# Patient Record
Sex: Male | Born: 2000 | Race: Black or African American | Hispanic: No | Marital: Single | Smoking: Never smoker
Health system: Southern US, Community
[De-identification: ages and names within clinical notes are randomized; demographics above are authoritative.]

## PROBLEM LIST (undated history)

## (undated) DIAGNOSIS — J45909 Unspecified asthma, uncomplicated: Secondary | ICD-10-CM

---
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
## 2021-04-08 ENCOUNTER — Encounter (HOSPITAL_COMMUNITY): Payer: Self-pay

## 2021-04-08 ENCOUNTER — Other Ambulatory Visit: Payer: Self-pay

## 2021-04-08 ENCOUNTER — Emergency Department (HOSPITAL_COMMUNITY)
Admission: EM | Admit: 2021-04-08 | Discharge: 2021-04-08 | Disposition: A | Discharge status: Home / Self Care | Payer: Medicaid Other | Attending: Emergency Medicine | Admitting: Emergency Medicine

## 2021-04-08 DIAGNOSIS — R0981 Nasal congestion: Secondary | ICD-10-CM | POA: Insufficient documentation

## 2021-04-08 DIAGNOSIS — R059 Cough, unspecified: Secondary | ICD-10-CM | POA: Diagnosis not present

## 2021-04-08 DIAGNOSIS — Z20822 Contact with and (suspected) exposure to covid-19: Secondary | ICD-10-CM | POA: Insufficient documentation

## 2021-04-08 DIAGNOSIS — M791 Myalgia, unspecified site: Secondary | ICD-10-CM | POA: Insufficient documentation

## 2021-04-08 DIAGNOSIS — R6889 Other general symptoms and signs: Secondary | ICD-10-CM

## 2021-04-08 LAB — RESP PANEL BY RT-PCR (FLU A&B, COVID) ARPGX2
Influenza A by PCR: NEGATIVE
Influenza B by PCR: NEGATIVE
SARS Coronavirus 2 by RT PCR: NEGATIVE

## 2021-04-08 MED ORDER — IBUPROFEN 200 MG PO TABS
600.0000 mg | ORAL_TABLET | Freq: Once | ORAL | Status: AC
  Administered 2021-04-08: 600 mg via ORAL
  Filled 2021-04-08: qty 3

## 2021-04-08 NOTE — ED Triage Notes (Signed)
Patient arrives from home with c/o cough, congestion, and SOB that began Monday after receiving his second COVID vaccination.

## 2021-04-08 NOTE — ED Provider Notes (Signed)
[facility]   CSN: [id number] Arrival date & time: 04/08/21  2001     History  Chief Complaint  Patient presents with   Cough   Nasal Congestion    William Hendricks is a 21 y.o. male who presents to the ED for mild cough, congestion and body aches that started earlier today.  Patient states he had the second COVID-vaccine 2 days prior and is concerned that he has COVID.  ED states that he had no reaction to the first vaccine.  He denies fevers, chills, shortness of breath.  He has no other complaints.   Cough Associated symptoms: myalgias   Associated symptoms: no fever, no headaches, no rash and no shortness of breath       Home Medications Prior to Admission medications   Medication Sig Start Date End Date Taking? Authorizing Provider  albuterol (VENTOLIN HFA) 108 (90 Base) MCG/ACT inhaler Inhale 2 puffs into the lungs every 6 (six) hours as needed for up to 10 days for wheezing or shortness of breath. 10/30/19 11/09/19  Mayers, Cari S, PA-C  naproxen (NAPROSYN) 375 MG tablet Take 1 tablet (375 mg total) by mouth 2 (two) times daily. Patient not taking: Reported on 10/30/2019 11/05/17   Patterson Hammersmith C, PA-C      Allergies    Patient has no known allergies.    Review of Systems   Review of Systems  Constitutional:  Negative for fever.  HENT: Negative.    Eyes: Negative.   Respiratory:  Positive for cough. Negative for shortness of breath.   Cardiovascular: Negative.   Gastrointestinal:  Negative for abdominal pain and vomiting.  Endocrine: Negative.   Genitourinary: Negative.   Musculoskeletal:  Positive for myalgias.  Skin:  Negative for rash.  Neurological:  Negative for headaches.  All other systems reviewed and are negative.  Physical Exam Updated Vital Signs BP 109/84 (BP Location: Left Arm)    Pulse 100    Temp 98.3 F (36.8 C) (Oral)    Resp 18    Ht 5\' 9"  (1.753 m)    Wt 58.5 kg    SpO2 100%    BMI 19.05 kg/m   Physical Exam Vitals and nursing note reviewed.  Constitutional:      General: He is not in acute distress.    Appearance: He is not ill-appearing.  HENT:     Head: Atraumatic.  Eyes:     Conjunctiva/sclera: Conjunctivae normal.  Cardiovascular:     Rate and Rhythm: Normal rate and regular rhythm.     Pulses: Normal pulses.     Heart sounds: No murmur heard. Pulmonary:     Effort: Pulmonary effort is normal. No respiratory distress.     Breath sounds: Normal breath sounds.     Comments: Speaking in full and complete sentences Abdominal:     General: Abdomen is flat. There is no distension.     Palpations: Abdomen is soft.     Tenderness: There is no abdominal tenderness.  Musculoskeletal:        General: Normal range of motion.     Cervical back: Normal range of motion.  Skin:    General: Skin is warm and dry.     Capillary Refill: Capillary refill takes less than 2 seconds.  Neurological:     General: No focal deficit present.     Mental Status: He is alert.  Psychiatric:        Mood and Affect: Mood  normal.    ED Results / Procedures / Treatments   Labs (all labs ordered are listed, but only abnormal results are displayed) Labs Reviewed  RESP PANEL BY RT-PCR (FLU A&B, COVID) ARPGX2    EKG None  Radiology No results found.  Procedures Procedures    Medications Ordered in ED Medications  ibuprofen (ADVIL) tablet 600 mg (600 mg Oral Given 04/08/21 2051)    ED Course/ Medical Decision Making/ A&P                           Medical Decision Making Risk OTC drugs.   History:  Per HPI  Initial impression:  This patient presents to the ED for concern of cough, this involves an extensive number of treatment options, and is a complaint that carries with it a high risk of complications and morbidity.     ED Course: Patient is well-appearing, vitals are stable he is in no apparent distress.  He is speaking in full complete sentences.  No acute findings on  exam.  I suspect that his symptoms are secondary to COVID-vaccine side effects, however will obtain COVID and flu panel for patient reassurance.  I will also give ibuprofen for body aches. Respiratory panel negative.  Patient feels much better after ibuprofen 600 mg.  Vitals remained stable.  Lab Tests and EKG:  I Ordered, reviewed, and interpreted labs and EKG.  The pertinent results are listed above  Disposition:  After consideration of the diagnostic results, physical exam, history and the patients response to treatment feel that the patent would benefit from discharge.   Flulike symptoms: Patient symptoms are likely side effect from COVID-vaccine.  No discernible symptoms on physical exam today.  Respiratory panel negative.  Reassurance provided and return precautions were discussed.  All questions asked and answered.  Discharged home in good condition         Final Clinical Impression(s) / ED Diagnoses Final diagnoses:  None    Rx / DC Orders ED Discharge Orders     None         Delight Ovens 04/08/21 2312    Linwood Dibbles, MD 04/11/21 0730

## 2021-04-08 NOTE — Discharge Instructions (Signed)
Your COVID and flu test were negative today.  I suspect her symptoms are secondary to the side effects of the COVID-vaccine.  Continue taking ibuprofen or Tylenol for your body aches and discomfort. Follow up with your primary care as needed.

## 2022-01-04 ENCOUNTER — Encounter (HOSPITAL_COMMUNITY): Payer: Self-pay | Admitting: Emergency Medicine

## 2022-01-04 ENCOUNTER — Emergency Department (HOSPITAL_COMMUNITY)
Admission: EM | Admit: 2022-01-04 | Discharge: 2022-01-04 | Disposition: A | Discharge status: Home / Self Care | Payer: Medicaid Other | Attending: Emergency Medicine | Admitting: Emergency Medicine

## 2022-01-04 DIAGNOSIS — J020 Streptococcal pharyngitis: Secondary | ICD-10-CM | POA: Diagnosis not present

## 2022-01-04 DIAGNOSIS — J029 Acute pharyngitis, unspecified: Secondary | ICD-10-CM | POA: Diagnosis present

## 2022-01-04 LAB — GROUP A STREP BY PCR: Group A Strep by PCR: DETECTED — AB

## 2022-01-04 MED ORDER — PENICILLIN G BENZATHINE 1200000 UNIT/2ML IM SUSY
1.2000 10*6.[IU] | PREFILLED_SYRINGE | Freq: Once | INTRAMUSCULAR | Status: AC
Start: 2022-01-04 — End: 2022-01-04
  Administered 2022-01-04: 1.2 10*6.[IU] via INTRAMUSCULAR
  Filled 2022-01-04 (×2): qty 2

## 2022-01-04 MED ORDER — PENICILLIN G BENZATHINE 600000 UNIT/ML IM SUSY: 600000.0000 [IU] | PREFILLED_SYRINGE | Freq: Once | INTRAMUSCULAR | Status: DC

## 2022-01-04 NOTE — ED Provider Notes (Addendum)
[facility] EMERGENCY DEPARTMENT Provider Note   CSN: [id number] Arrival date & time: 01/04/22  0802     History  Chief Complaint  Patient presents with   Sore Throat   HPI William Hendricks is a 21 y.o. male presenting for sore throat. Started about a week ago. Throat has been sore and "scratchy". Patient also states he's "felt tired and sick". Denies fever and cough. Denies sick contact. Denies trouble swallowing and drooling.    Sore Throat       Home Medications Prior to Admission medications   Medication Sig Start Date End Date Taking? Authorizing Provider  albuterol (VENTOLIN HFA) 108 (90 Base) MCG/ACT inhaler Inhale 2 puffs into the lungs every 6 (six) hours as needed for up to 10 days for wheezing or shortness of breath. 10/30/19 11/09/19  Mayers, Cari S, PA-C  naproxen (NAPROSYN) 375 MG tablet Take 1 tablet (375 mg total) by mouth 2 (two) times daily. Patient not taking: Reported on 10/30/2019 11/05/17   Debara Pickett C, PA-C      Allergies    Patient has no known allergies.    Review of Systems   Review of Systems  HENT:  Positive for sore throat.     Physical Exam Updated Vital Signs BP 124/75 (BP Location: Right Arm)   Pulse 90   Temp 98.1 F (36.7 C) (Oral)   Resp 16   SpO2 100%  Physical Exam Vitals and nursing note reviewed.  HENT:     Head: Normocephalic and atraumatic.     Mouth/Throat:     Mouth: Mucous membranes are moist.     Pharynx: Uvula midline. Oropharyngeal exudate and posterior oropharyngeal erythema present.     Tonsils: Tonsillar exudate present.  Eyes:     General:        Right eye: No discharge.        Left eye: No discharge.     Conjunctiva/sclera: Conjunctivae normal.  Cardiovascular:     Rate and Rhythm: Normal rate and regular rhythm.     Pulses: Normal pulses.     Heart sounds: Normal heart sounds.  Pulmonary:     Effort: Pulmonary effort is normal.     Breath sounds: Normal breath sounds.  Abdominal:      General: Abdomen is flat.     Palpations: Abdomen is soft.  Skin:    General: Skin is warm and dry.  Neurological:     General: No focal deficit present.  Psychiatric:        Mood and Affect: Mood normal.     ED Results / Procedures / Treatments   Labs (all labs ordered are listed, but only abnormal results are displayed) Labs Reviewed  GROUP A STREP BY PCR - Abnormal; Notable for the following components:      Result Value   Group A Strep by PCR DETECTED (*)    All other components within normal limits    EKG None  Radiology No results found.  Procedures Procedures    Medications Ordered in ED Medications  penicillin g benzathine (BICILLIN LA) 1200000 UNIT/2ML injection 1.2 Million Units (has no administration in time range)    ED Course/ Medical Decision Making/ A&P                           Medical Decision Making Risk Prescription drug management.   Patient presented for sore throat. Exam revealed tonsillar exudate and erythematous posterior  pharynx. Patient also mentioned no cough. Rapid strep was positive. Symptoms, exam and positive rapid strep consistent with strep pharyngitis. Patient requested one time shot. Treated with IM Bicillin. Discussed return precautions. Did consider peritonsillar abscess but unlikely given no peritonsillar swelling. Advise patient to f/u with PCP if symptoms persisted.         Final Clinical Impression(s) / ED Diagnoses Final diagnoses:  Strep pharyngitis    Rx / DC Orders ED Discharge Orders     None         Harriet Pho, PA-C 01/04/22 0934    Harriet Pho, PA-C 01/04/22 [id number]    Margette Fast, MD 01/04/22 1009

## 2022-01-04 NOTE — Discharge Instructions (Addendum)
Evaluation of your sore throat revealed that you have strep pharyngitis or "strep throat". Treated with one time shot of Bicillin which is an antibiotic. Advise to follow up with PCP if symptoms persist.

## 2022-01-04 NOTE — ED Triage Notes (Signed)
Pt here from home with c/o sore throat , throat is red and painful to swallow  

## 2022-05-25 ENCOUNTER — Emergency Department (HOSPITAL_COMMUNITY)
Admission: EM | Admit: 2022-05-25 | Discharge: 2022-05-25 | Discharge status: Against Medical Advice | Payer: Medicaid Other | Attending: Emergency Medicine | Admitting: Emergency Medicine

## 2022-05-25 ENCOUNTER — Other Ambulatory Visit: Payer: Self-pay

## 2022-05-25 ENCOUNTER — Encounter (HOSPITAL_COMMUNITY): Payer: Self-pay

## 2022-05-25 DIAGNOSIS — Z5321 Procedure and treatment not carried out due to patient leaving prior to being seen by health care provider: Secondary | ICD-10-CM | POA: Diagnosis not present

## 2022-05-25 DIAGNOSIS — R200 Anesthesia of skin: Secondary | ICD-10-CM | POA: Diagnosis not present

## 2022-05-25 DIAGNOSIS — R4701 Aphasia: Secondary | ICD-10-CM | POA: Diagnosis not present

## 2022-05-25 DIAGNOSIS — R29810 Facial weakness: Secondary | ICD-10-CM | POA: Insufficient documentation

## 2022-05-25 DIAGNOSIS — Z1152 Encounter for screening for COVID-19: Secondary | ICD-10-CM | POA: Insufficient documentation

## 2022-05-25 LAB — RESP PANEL BY RT-PCR (RSV, FLU A&B, COVID)  RVPGX2
Influenza A by PCR: NEGATIVE
Influenza B by PCR: NEGATIVE
Resp Syncytial Virus by PCR: NEGATIVE
SARS Coronavirus 2 by RT PCR: NEGATIVE

## 2022-05-25 NOTE — ED Notes (Signed)
Informed by NT that pt left

## 2022-05-25 NOTE — ED Triage Notes (Signed)
Pt reports sitting in parking lot yesterday when right hand went numb, facial drooping, congestion, runny nose, headache and suddenly had aphasia.  Pt reports all symptoms have resolved now besides headache, runny nose, and congestion

## 2022-07-25 ENCOUNTER — Emergency Department (HOSPITAL_COMMUNITY)
Admission: EM | Admit: 2022-07-25 | Discharge: 2022-07-25 | Disposition: A | Discharge status: Home / Self Care | Payer: Medicaid Other | Attending: Emergency Medicine | Admitting: Emergency Medicine

## 2022-07-25 ENCOUNTER — Encounter (HOSPITAL_COMMUNITY): Payer: Self-pay

## 2022-07-25 ENCOUNTER — Emergency Department (HOSPITAL_COMMUNITY): Payer: Medicaid Other

## 2022-07-25 DIAGNOSIS — Z20822 Contact with and (suspected) exposure to covid-19: Secondary | ICD-10-CM | POA: Diagnosis not present

## 2022-07-25 DIAGNOSIS — J069 Acute upper respiratory infection, unspecified: Secondary | ICD-10-CM | POA: Insufficient documentation

## 2022-07-25 DIAGNOSIS — R059 Cough, unspecified: Secondary | ICD-10-CM | POA: Diagnosis present

## 2022-07-25 LAB — RESP PANEL BY RT-PCR (RSV, FLU A&B, COVID)  RVPGX2
Influenza A by PCR: NEGATIVE
Influenza B by PCR: NEGATIVE
Resp Syncytial Virus by PCR: NEGATIVE
SARS Coronavirus 2 by RT PCR: NEGATIVE

## 2022-07-25 MED ORDER — BENZONATATE 100 MG PO CAPS: 100.0000 mg | ORAL_CAPSULE | Freq: Three times a day (TID) | ORAL | 0 refills | Status: DC

## 2022-07-25 MED ORDER — ERYTHROMYCIN 5 MG/GM OP OINT: TOPICAL_OINTMENT | OPHTHALMIC | 0 refills | Status: DC

## 2022-07-25 NOTE — Discharge Instructions (Addendum)
It was a pleasure taking care of you!   Your COVID, RSV, Flu swab was negative. Your chest xray didn't show any acute abnormalities. You may take over the counter 600 mg Ibuprofen every 6 hours and alternate with 500 mg Tylenol every 6 hours as needed for pain for no more than 7 days.  You may take over-the-counter cough and cold medications as needed for your symptoms. For your cough, you will be sent a prescription called Tessalon perles, take as directed. Ensure to maintain fluid intake with tea, soup, broth, Pedialyte, Gatorade, water. You will be sent a prescription for erythromycin ointment, take as directed. You may follow-up with your primary care provider as needed.  Return to the Emergency Department if you are experiencing increasing/worsening symptoms.

## 2022-07-25 NOTE — ED Triage Notes (Signed)
Pt states he had visitors with a cold from 5/10-5/13. Pt c/o productive cough, runny nose, watery eyes.  Minor relief with mucinex, robotussin, dayquil, nyquil No relief with alka seltzer  Pt has taking zinc and vitamins

## 2022-07-25 NOTE — ED Provider Notes (Signed)
[place] [facility] AT [facility] Provider Note   CSN: [id number] Arrival date & time: 07/25/22  1809     History  Chief Complaint  Patient presents with   Cough    William Hendricks is a 22 y.o. male who presents emergency department with concerns for cough onset 10 days.  Notes recent sick contacts.  Has associated rhinorrhea, watery eyes, nasal congestion.  Has taken over-the-counter medications with no relief of his symptoms.  Denies trouble breathing, fever.  Patient does not wear glasses or contacts.  Notes concerns for drainage to bilateral eyes.  The history is provided by the patient. No language interpreter was used.       Home Medications Prior to Admission medications   Medication Sig Start Date End Date Taking? Authorizing Provider  benzonatate (TESSALON) 100 MG capsule Take 1 capsule (100 mg total) by mouth every 8 (eight) hours. 07/25/22  Yes Valborg Friar A, PA-C  erythromycin ophthalmic ointment Place a 1/2 inch ribbon of ointment into the lower eyelid for up to 4 (four) times a day for 5 (five) days. 07/25/22  Yes Mikyle Sox A, PA-C  albuterol (VENTOLIN HFA) 108 (90 Base) MCG/ACT inhaler Inhale 2 puffs into the lungs every 6 (six) hours as needed for up to 10 days for wheezing or shortness of breath. 10/30/19 11/09/19  Mayers, Cari S, PA-C  naproxen (NAPROSYN) 375 MG tablet Take 1 tablet (375 mg total) by mouth 2 (two) times daily. Patient not taking: Reported on 10/30/2019 11/05/17   Patterson Hammersmith C, PA-C      Allergies    Patient has no known allergies.    Review of Systems   Review of Systems  Respiratory:  Positive for cough.   All other systems reviewed and are negative.   Physical Exam Updated Vital Signs BP 126/81 (BP Location: Right Arm)   Pulse (!) 116   Temp 98.9 F (37.2 C) (Oral)   Resp 18   Ht 5\' 9"  (1.753 m)   Wt 56.7 kg   SpO2 100%   BMI 18.46 kg/m  Physical Exam Vitals and nursing note reviewed.  Constitutional:       General: He is not in acute distress.    Appearance: Normal appearance.  Eyes:     General: No scleral icterus.    Extraocular Movements: Extraocular movements intact.     Pupils: Pupils are equal, round, and reactive to light.     Comments: PERRL.  EOMI.  Cardiovascular:     Rate and Rhythm: Normal rate and regular rhythm.     Pulses: Normal pulses.     Heart sounds: Normal heart sounds.  Pulmonary:     Effort: Pulmonary effort is normal. No respiratory distress.     Breath sounds: Normal breath sounds.  Abdominal:     Palpations: Abdomen is soft. There is no mass.     Tenderness: There is no abdominal tenderness.  Musculoskeletal:        General: Normal range of motion.     Cervical back: Neck supple.  Skin:    General: Skin is warm and dry.     Findings: No rash.  Neurological:     Mental Status: He is alert.     Sensory: Sensation is intact.     Motor: Motor function is intact.  Psychiatric:        Behavior: Behavior normal.     ED Results / Procedures / Treatments   Labs (all labs ordered  are listed, but only abnormal results are displayed) Labs Reviewed  RESP PANEL BY RT-PCR (RSV, FLU A&B, COVID)  RVPGX2    EKG None  Radiology DG Chest 2 View  Result Date: 07/25/2022 CLINICAL DATA:  Cough and sore throat. EXAM: CHEST - 2 VIEW COMPARISON:  January 21, 2014 FINDINGS: The heart size and mediastinal contours are within normal limits. Both lungs are clear. The visualized skeletal structures are unremarkable. IMPRESSION: No active cardiopulmonary disease. Electronically Signed   By: Aram Candela M.D.   On: 07/25/2022 19:11    Procedures Procedures    Medications Ordered in ED Medications - No data to display  ED Course/ Medical Decision Making/ A&P Clinical Course as of 07/25/22 2017  Sun Jul 25, 2022  2000 Re-evaluated and noted improvement of symptoms with treatment regimen. Discussed discharge treatment plan. Pt agreeable at this time. Pt  appears safe for discharge. [SB]    Clinical Course User Index [SB] Clennon Nasca A, PA-C                             Medical Decision Making Amount and/or Complexity of Data Reviewed Radiology: ordered.  Risk Prescription drug management.   Pt presents with concerns for cough onset 10 days.  Sick contacts at home with similar symptoms.  No fever at home.  Patient afebrile. On exam, pt with no acute cardiovascular, respiratory, abdominal exam findings. Differential diagnosis includes COVID, flu, viral URI with cough, PNA.    Labs:  I ordered, and personally interpreted labs.  The pertinent results include:   Negative COVID, flu, RSV swab  Imaging: I ordered imaging studies including Chest x-ray I independently visualized and interpreted imaging which showed: No acute findings I agree with the radiologist interpretation  Disposition: Please stay suspicious for viral URI with cough.  Doubt concerns this time for COVID, flu, RSV, pneumonia. After consideration of the diagnostic results and the patients response to treatment, I feel that the patient would benefit from Discharge home.  Patient discharged home with prescription for Tessalon Perles and erythromycin ointment.  Will send erythromycin ointment due to patient's concerns for bilateral eye drainage.  Work note provided.  Supportive care measures and strict return precautions discussed with patient at bedside. Pt acknowledges and verbalizes understanding. Pt appears safe for discharge. Follow up as indicated in discharge paperwork.    This chart was dictated using voice recognition software, Dragon. Despite the best efforts of this provider to proofread and correct errors, errors may still occur which can change documentation meaning.   Final Clinical Impression(s) / ED Diagnoses Final diagnoses:  Viral URI with cough    Rx / DC Orders ED Discharge Orders          Ordered    benzonatate (TESSALON) 100 MG capsule  Every 8  hours        07/25/22 2015    erythromycin ophthalmic ointment        07/25/22 2015              Doral Digangi A, PA-C 07/25/22 2020    Eber Hong, MD 07/27/22 [phone]

## 2022-08-22 ENCOUNTER — Encounter (HOSPITAL_COMMUNITY): Payer: Self-pay

## 2022-08-22 ENCOUNTER — Ambulatory Visit (HOSPITAL_COMMUNITY)
Admission: EM | Admit: 2022-08-22 | Discharge: 2022-08-22 | Disposition: A | Discharge status: Home / Self Care | Payer: Medicaid Other | Attending: Emergency Medicine | Admitting: Emergency Medicine

## 2022-08-22 DIAGNOSIS — L03011 Cellulitis of right finger: Secondary | ICD-10-CM | POA: Diagnosis not present

## 2022-08-22 MED ORDER — DOXYCYCLINE HYCLATE 100 MG PO CAPS: 100.0000 mg | ORAL_CAPSULE | Freq: Two times a day (BID) | ORAL | 0 refills | Status: DC

## 2022-08-22 NOTE — ED Triage Notes (Signed)
Pt is here for R-hand middle finger infection. Pt reports pain and swelling x 3 days.

## 2022-08-22 NOTE — Discharge Instructions (Signed)
Today you are being treated for fingernail infection, based on exam it is noted in the point where it can be drained here in the office, may drain on its own at home, typically will help if this occurs once it is done  Take doxycycline every morning and every evening for 7 days  Tylenol and/or ibuprofen every 6 hours as needed for pain  Hold warm-hot compresses to affected area at least 4 times a day, this helps to facilitate draining, the more the better  Please return for evaluation for increased swelling, increased tenderness or pain, non healing site, non draining site, you begin to have fever or chills

## 2022-08-22 NOTE — ED Provider Notes (Signed)
[facility]    CSN: [id number] Arrival date & time: 08/22/22  1534      History   Chief Complaint Chief Complaint  Patient presents with   Nail Problem    HPI William Hendricks is a 22 y.o. male.   Presents for evaluation of pain and swelling present to the right middle finger nail for 7 days.  Symptoms began after pulling out a hangnail which did cause the area to bleed, subsided after few minutes.  Attempted use of ice and ibuprofen which has been minimally effective.  Denies drainage or fevers.   Past Medical History:  Diagnosis Date   Asthma     Patient Active Problem List   Diagnosis Date Noted   Cough 10/30/2019   Wheezing 10/30/2019    History reviewed. No pertinent surgical history.     Home Medications    Prior to Admission medications   Medication Sig Start Date End Date Taking? Authorizing Provider  albuterol (VENTOLIN HFA) 108 (90 Base) MCG/ACT inhaler Inhale 2 puffs into the lungs every 6 (six) hours as needed for up to 10 days for wheezing or shortness of breath. 10/30/19 11/09/19  Mayers, Cari S, PA-C  benzonatate (TESSALON) 100 MG capsule Take 1 capsule (100 mg total) by mouth every 8 (eight) hours. 07/25/22   Blue, Soijett A, PA-C  erythromycin ophthalmic ointment Place a 1/2 inch ribbon of ointment into the lower eyelid for up to 4 (four) times a day for 5 (five) days. 07/25/22   Blue, Soijett A, PA-C  naproxen (NAPROSYN) 375 MG tablet Take 1 tablet (375 mg total) by mouth 2 (two) times daily. Patient not taking: Reported on 10/30/2019 11/05/17   Lew Dawes, PA-C    Family History History reviewed. No pertinent family history.  Social History Social History   Tobacco Use   Smoking status: Never   Smokeless tobacco: Never  Vaping Use   Vaping Use: Former  Substance Use Topics   Alcohol use: Yes    Comment: occ   Drug use: Never     Allergies   Patient has no known allergies.   Review of Systems Review of  Systems   Physical Exam Triage Vital Signs ED Triage Vitals [08/22/22 1649]  Enc Vitals Group     BP 110/60     Pulse Rate 81     Resp 14     Temp 98.1 F (36.7 C)     Temp Source Oral     SpO2 97 %     Weight      Height      Head Circumference      Peak Flow      Pain Score      Pain Loc      Pain Edu?      Excl. in GC?    No data found.  Updated Vital Signs BP 110/60 (BP Location: Left Arm)   Pulse 81   Temp 98.1 F (36.7 C) (Oral)   Resp 14   SpO2 97%   Visual Acuity Right Eye Distance:   Left Eye Distance:   Bilateral Distance:    Right Eye Near:   Left Eye Near:    Bilateral Near:     Physical Exam Constitutional:      Appearance: Normal appearance.  Eyes:     Extraocular Movements: Extraocular movements intact.  Skin:    Comments: Mild to moderate swelling, tenderness present to the lateral aspect of the right middle  finger nail, no drainage noted, sensation intact, capillary refill less than 3  Neurological:     Mental Status: He is alert and oriented to person, place, and time. Mental status is at baseline.      UC Treatments / Results  Labs (all labs ordered are listed, but only abnormal results are displayed) Labs Reviewed - No data to display  EKG   Radiology No results found.  Procedures Procedures (including critical care time)  Medications Ordered in UC Medications - No data to display  Initial Impression / Assessment and Plan / UC Course  I have reviewed the triage vital signs and the nursing notes.  Pertinent labs & imaging results that were available during my care of the patient were reviewed by me and considered in my medical decision making (see chart for details).  Paronychia of the right middle finger  Presentation and symptomology consistent with infection, discussed with patient, doxycycline prescribed, recommended over-the-counter analgesics and warm compresses for comfort, given short precautions to follow-up  for nonhealing site Final Clinical Impressions(s) / UC Diagnoses   Final diagnoses:  None   Discharge Instructions   None    ED Prescriptions   None    PDMP not reviewed this encounter.   Valinda Hoar, NP 08/22/22 1739

## 2023-02-28 ENCOUNTER — Other Ambulatory Visit: Payer: Self-pay

## 2023-02-28 ENCOUNTER — Emergency Department (HOSPITAL_COMMUNITY)
Admission: EM | Admit: 2023-02-28 | Discharge: 2023-03-01 | Discharge status: Against Medical Advice | Payer: Medicaid Other | Attending: Emergency Medicine | Admitting: Emergency Medicine

## 2023-02-28 ENCOUNTER — Emergency Department (HOSPITAL_COMMUNITY): Payer: Medicaid Other

## 2023-02-28 ENCOUNTER — Encounter (HOSPITAL_COMMUNITY): Payer: Self-pay

## 2023-02-28 DIAGNOSIS — Y990 Civilian activity done for income or pay: Secondary | ICD-10-CM | POA: Insufficient documentation

## 2023-02-28 DIAGNOSIS — S0993XA Unspecified injury of face, initial encounter: Secondary | ICD-10-CM | POA: Insufficient documentation

## 2023-02-28 DIAGNOSIS — W228XXA Striking against or struck by other objects, initial encounter: Secondary | ICD-10-CM | POA: Diagnosis not present

## 2023-02-28 DIAGNOSIS — R930 Abnormal findings on diagnostic imaging of skull and head, not elsewhere classified: Secondary | ICD-10-CM | POA: Diagnosis not present

## 2023-02-28 DIAGNOSIS — S6992XA Unspecified injury of left wrist, hand and finger(s), initial encounter: Secondary | ICD-10-CM | POA: Insufficient documentation

## 2023-02-28 NOTE — ED Triage Notes (Signed)
Pt POV from work - had a injury at work - went to UC first and had UA but they sent him here for CT - pt was in cage of forklift and forklift his left face and left arm.

## 2023-03-01 ENCOUNTER — Emergency Department (HOSPITAL_COMMUNITY): Payer: Medicaid Other

## 2023-03-01 MED ORDER — OXYCODONE-ACETAMINOPHEN 5-325 MG PO TABS
1.0000 | ORAL_TABLET | Freq: Once | ORAL | Status: AC
  Administered 2023-03-01: 1 via ORAL
  Filled 2023-03-01: qty 1

## 2023-03-01 NOTE — ED Provider Triage Note (Signed)
 Emergency Medicine Provider Triage Evaluation Note  William Hendricks , a 22 y.o. male  was evaluated in triage.  Pt complains of facial injury.  Review of Systems  Positive: Left facial pain, headache Negative: Neck pain, nausea, vomiting  Physical Exam  BP 126/85 (BP Location: Left Arm)   Pulse 90   Temp 98.4 F (36.9 C)   Resp 16   Ht 5' 9 (1.753 m)   Wt 68 kg   SpO2 99%   BMI 22.15 kg/m  Gen:   Awake, no distress   Resp:  Normal effort  MSK:   Moves extremities without difficulty Swelling to left forearm. No deformity.  Other:  No malocclusion. Tender over left zygoma. No pain with full eye movement. No cervical tenderness.   Medical Decision Making  Medically screening exam initiated at 12:03 AM.  Appropriate orders placed.  William Hendricks was informed that the remainder of the evaluation will be completed by another provider, this initial triage assessment does not replace that evaluation, and the importance of remaining in the ED until their evaluation is complete.  Patient hit in the face and left arm by fork lift. No LOC, nausea, vomiting.    Odell Balls, PA-C 03/01/23 0005

## 2023-03-01 NOTE — ED Notes (Signed)
Pt was called x3 for vitals No answer

## 2024-01-20 ENCOUNTER — Ambulatory Visit (HOSPITAL_COMMUNITY)
Admission: EM | Admit: 2024-01-20 | Discharge: 2024-01-20 | Disposition: A | Discharge status: Home / Self Care | Attending: Emergency Medicine | Admitting: Emergency Medicine

## 2024-01-20 ENCOUNTER — Encounter (HOSPITAL_COMMUNITY): Payer: Self-pay

## 2024-01-20 DIAGNOSIS — J029 Acute pharyngitis, unspecified: Secondary | ICD-10-CM | POA: Diagnosis present

## 2024-01-20 DIAGNOSIS — J988 Other specified respiratory disorders: Secondary | ICD-10-CM | POA: Insufficient documentation

## 2024-01-20 DIAGNOSIS — B9789 Other viral agents as the cause of diseases classified elsewhere: Secondary | ICD-10-CM | POA: Diagnosis present

## 2024-01-20 DIAGNOSIS — R051 Acute cough: Secondary | ICD-10-CM | POA: Insufficient documentation

## 2024-01-20 LAB — POCT INFLUENZA A/B
Influenza A, POC: NEGATIVE
Influenza B, POC: NEGATIVE

## 2024-01-20 LAB — POC SOFIA SARS ANTIGEN FIA: SARS Coronavirus 2 Ag: NEGATIVE

## 2024-01-20 LAB — POCT RAPID STREP A (OFFICE): Rapid Strep A Screen: NEGATIVE

## 2024-01-20 MED ORDER — IBUPROFEN 600 MG PO TABS
600.0000 mg | ORAL_TABLET | Freq: Four times a day (QID) | ORAL | 0 refills | Status: AC | PRN
Start: 2024-01-20 — End: ?

## 2024-01-20 NOTE — ED Provider Notes (Signed)
 [facility]    CSN: [id number] Arrival date & time: 01/20/24  0920      History   Chief Complaint Chief Complaint  Patient presents with   Cough   Nasal Congestion   Generalized Body Aches   Weakness    HPI William Hendricks is a 23 y.o. male.   Patient presents with sore throat, and nasal congestion, mild cough, body aches, and fatigue that began yesterday.  Patient states that symptoms initially began with a sore throat and that he had progressively worsening body aches.  Patient denies any known fever.  Patient denies chest pain, shortness of breath, nausea, vomiting, diarrhea, and abdominal pain.    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  Patient states he has been taking Robitussin, DayQuil, and BC powder with some relief.  The history is provided by the patient and medical records.  Cough Weakness Associated symptoms: cough     Past Medical History:  Diagnosis Date   Asthma     Patient Active Problem List   Diagnosis Date Noted   Cough 10/30/2019   Wheezing 10/30/2019    History reviewed. No pertinent surgical history.     Home Medications    Prior to Admission medications   Medication Sig Start Date End Date Taking? Authorizing Provider  ibuprofen  (ADVIL ) 600 MG tablet Take 1 tablet (600 mg total) by mouth every 6 (six) hours as needed. 01/20/24  Yes Johnie Rumaldo LABOR, NP    Family History History reviewed. No pertinent family history.  Social History Social History   Tobacco Use   Smoking status: Never   Smokeless tobacco: Never  Vaping Use   Vaping status: Some Days   Substances: Flavoring  Substance Use Topics   Alcohol use: Yes    Comment: occ   Drug use: Never     Allergies   Patient has no known allergies.   Review of Systems Review of Systems  Respiratory:  Positive for cough.   Neurological:  Positive for weakness.   Per  HPI  Physical Exam Triage Vital Signs ED Triage Vitals  Encounter Vitals Group     BP 01/20/24 1056 111/80     Girls Systolic BP Percentile --      Girls Diastolic BP Percentile --      Boys Systolic BP Percentile --      Boys Diastolic BP Percentile --      Pulse Rate 01/20/24 1056 99     Resp 01/20/24 1056 16     Temp 01/20/24 1056 99.5 F (37.5 C)     Temp Source 01/20/24 1056 Oral     SpO2 01/20/24 1056 96 %     Weight --      Height --      Head Circumference --      Peak Flow --      Pain Score 01/20/24 1055 2     Pain Loc --      Pain Education --      Exclude from Growth Chart --    No data found.  Updated Vital Signs BP 111/80 (BP Location: Left Arm)   Pulse 99   Temp 99.5 F (37.5 C) (Oral)   Resp 16   SpO2 96%   Visual Acuity Right Eye Distance:   Left Eye Distance:   Bilateral Distance:    Right Eye Near:  Left Eye Near:    Bilateral Near:     Physical Exam Vitals and nursing note reviewed.  Constitutional:      General: He is awake. He is not in acute distress.    Appearance: Normal appearance. He is well-developed and well-groomed. He is ill-appearing. He is not toxic-appearing or diaphoretic.  HENT:     Right Ear: Tympanic membrane, ear canal and external ear normal.     Left Ear: Tympanic membrane, ear canal and external ear normal.     Nose: Congestion and rhinorrhea present.     Mouth/Throat:     Mouth: Mucous membranes are moist.     Pharynx: Pharyngeal swelling, posterior oropharyngeal erythema and postnasal drip present. No oropharyngeal exudate or uvula swelling.     Tonsils: No tonsillar exudate. 1+ on the right. 1+ on the left.  Cardiovascular:     Rate and Rhythm: Normal rate and regular rhythm.  Pulmonary:     Effort: Pulmonary effort is normal.     Breath sounds: Normal breath sounds.  Abdominal:     General: Abdomen is flat. Bowel sounds are normal. There is no distension.     Palpations: Abdomen is soft.     Tenderness:  There is no abdominal tenderness. There is no guarding or rebound.  Skin:    General: Skin is warm and dry.  Neurological:     General: No focal deficit present.     Mental Status: He is alert and oriented to person, place, and time. Mental status is at baseline.  Psychiatric:        Behavior: Behavior is cooperative.      UC Treatments / Results  Labs (all labs ordered are listed, but only abnormal results are displayed) Labs Reviewed  CULTURE, GROUP A STREP [facility])  POCT INFLUENZA A/B  POC SOFIA SARS ANTIGEN FIA  POCT RAPID STREP A (OFFICE)    EKG   Radiology No results found.  Procedures Procedures (including critical care time)  Medications Ordered in UC Medications - No data to display  Initial Impression / Assessment and Plan / UC Course  I have reviewed the triage vital signs and the nursing notes.  Pertinent labs & imaging results that were available during my care of the patient were reviewed by me and considered in my medical decision making (see chart for details).     Patient is mildly ill-appearing.  Vitals are stable.  Mild congestion and rhinorrhea present, mild erythema, swelling, and PND noted to posterior oropharynx.  Lungs clear bilaterally.  COVID, flu, and rapid strep testing all negative in clinic today.  Will send strep culture just to confirm.  Symptoms likely viral in nature.  Prescribed ibuprofen  as needed for pain or fever.  Discussed over-the-counter medications for symptoms.  Discussed follow-up and return precautions. Final Clinical Impressions(s) / UC Diagnoses   Final diagnoses:  Acute cough  Sore throat  Viral respiratory illness     Discharge Instructions      Your COVID, flu, and strep testing were all negative in clinic today.  We have sent a culture to confirm that there is no presence of strep throat.  These results will return over the next few days and if results are positive someone will call and advise treatment at that  time. Otherwise I do believe your symptoms are likely related to a viral respiratory illness. Alternate between 600 mg of ibuprofen  and 650 mg of Tylenol  every 6-8 hours as needed for any sore throat,  body aches, or fever. You can take over-the-counter Mucinex  for any cough or congestion. Make sure you are staying hydrated and getting plenty of rest. Follow-up with your primary care provider or return here as needed.     ED Prescriptions     Medication Sig Dispense Auth. Provider   ibuprofen  (ADVIL ) 600 MG tablet Take 1 tablet (600 mg total) by mouth every 6 (six) hours as needed. 30 tablet Johnie Flaming A, NP      PDMP not reviewed this encounter.   Johnie Flaming A, NP 01/20/24 1157

## 2024-01-20 NOTE — ED Triage Notes (Signed)
 Patient reports that he began having nasal congestion, a non productive cough, body aches, and weakness since yesterday.  Patient has been taking robitussin cough and cold, Dayquil, and BC powder.

## 2024-01-20 NOTE — Discharge Instructions (Addendum)
 Your COVID, flu, and strep testing were all negative in clinic today.  We have sent a culture to confirm that there is no presence of strep throat.  These results will return over the next few days and if results are positive someone will call and advise treatment at that time. Otherwise I do believe your symptoms are likely related to a viral respiratory illness. Alternate between 600 mg of ibuprofen  and 650 mg of Tylenol  every 6-8 hours as needed for any sore throat, body aches, or fever. You can take over-the-counter Mucinex  for any cough or congestion. Make sure you are staying hydrated and getting plenty of rest. Follow-up with your primary care provider or return here as needed.

## 2024-01-22 LAB — CULTURE, GROUP A STREP (THRC)

## 2024-01-23 ENCOUNTER — Ambulatory Visit (HOSPITAL_COMMUNITY): Payer: Self-pay
